# Patient Record
Sex: Female | Born: 1970 | Race: Black or African American | Hispanic: No | Marital: Single | Smoking: Current every day smoker
Health system: Southern US, Community
[De-identification: ages and names within clinical notes are randomized; demographics above are authoritative.]

---

## 2004-09-26 ENCOUNTER — Emergency Department: Payer: Self-pay | Admitting: Emergency Medicine

## 2004-09-27 ENCOUNTER — Other Ambulatory Visit: Payer: Self-pay

## 2006-05-18 IMAGING — CR DG CHEST 2V
1 series · 2 of 2 positions shown · non-contrast
Comparison: none

REASON FOR EXAM: Chest pain, cough
COMMENTS:

PROCEDURE:     DXR - DXR CHEST PA (OR AP) AND LATERAL  - September 27, 2004  [DATE]
RESULT:     Two views of the chest show the lungs are clear. The heart and
pulmonary vessels are normal. The bony and mediastinal structures are
unremarkable.

[Series 1: view not recorded · 0.17mm/px · 2 of 2 slices shown]
[im 1/2]
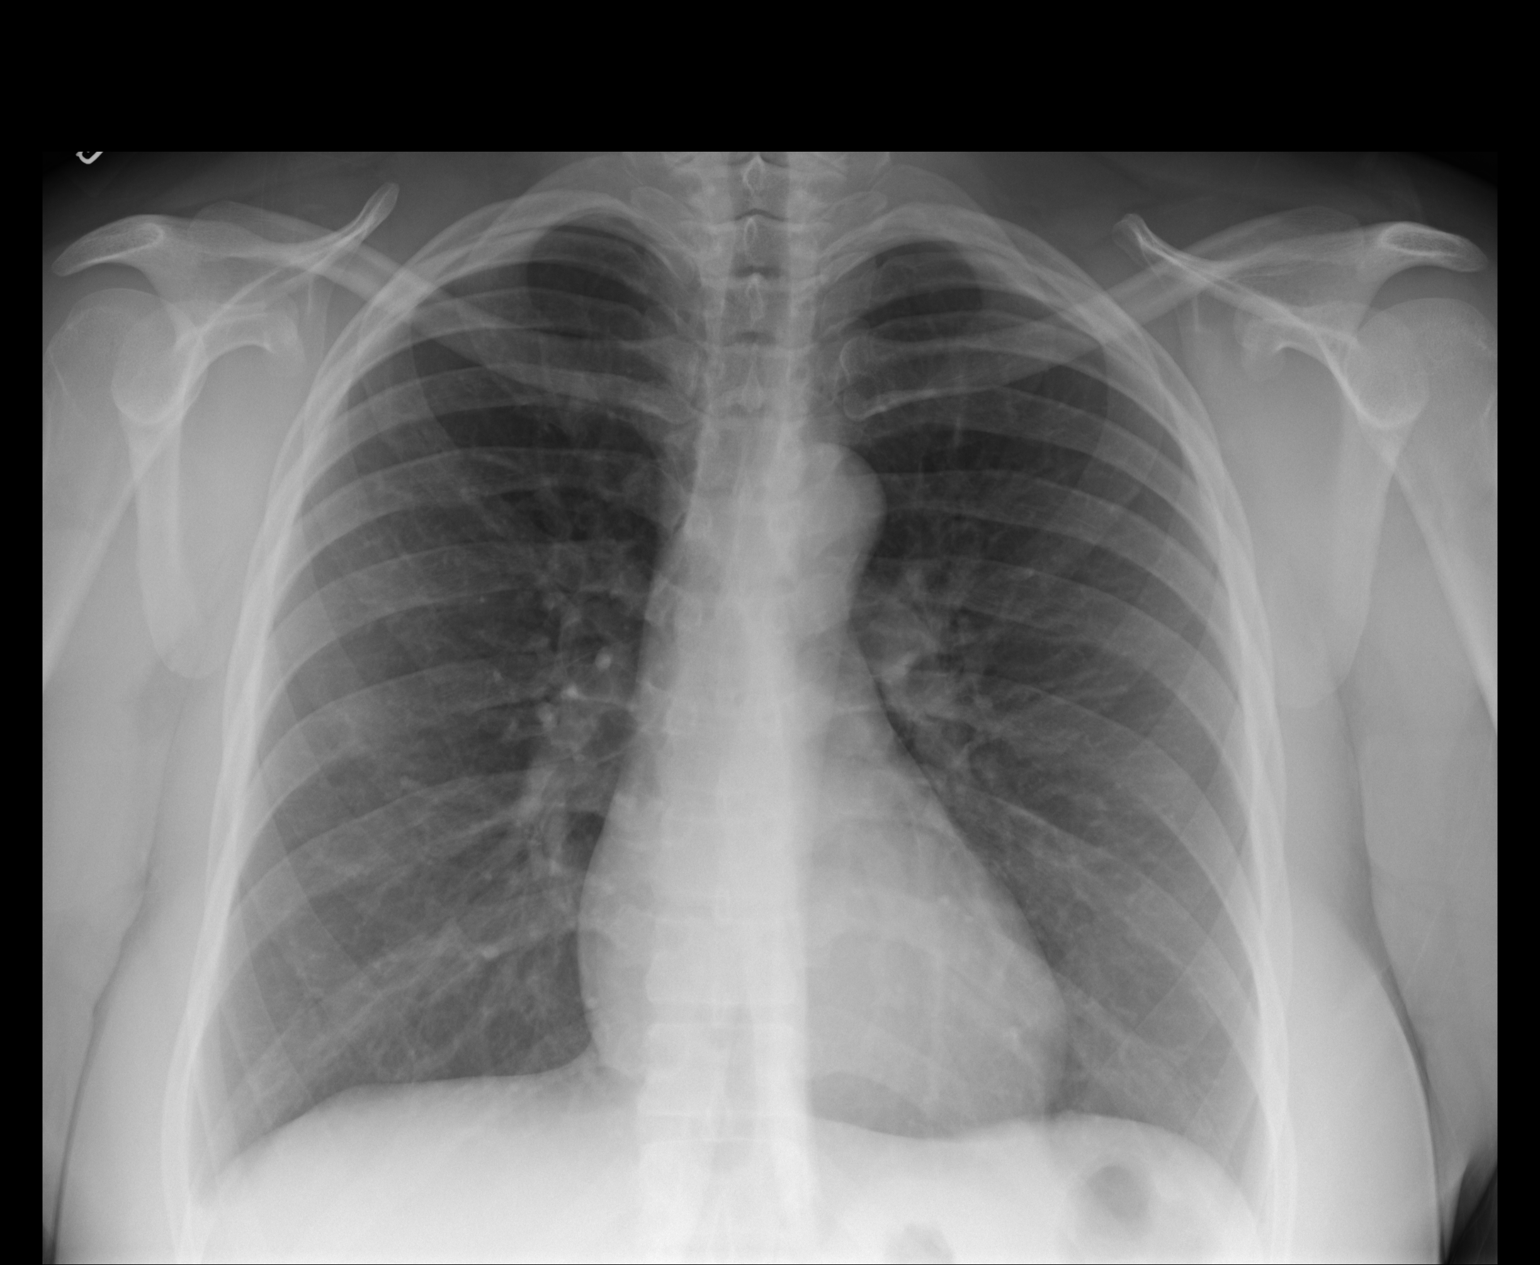
[im 2/2]
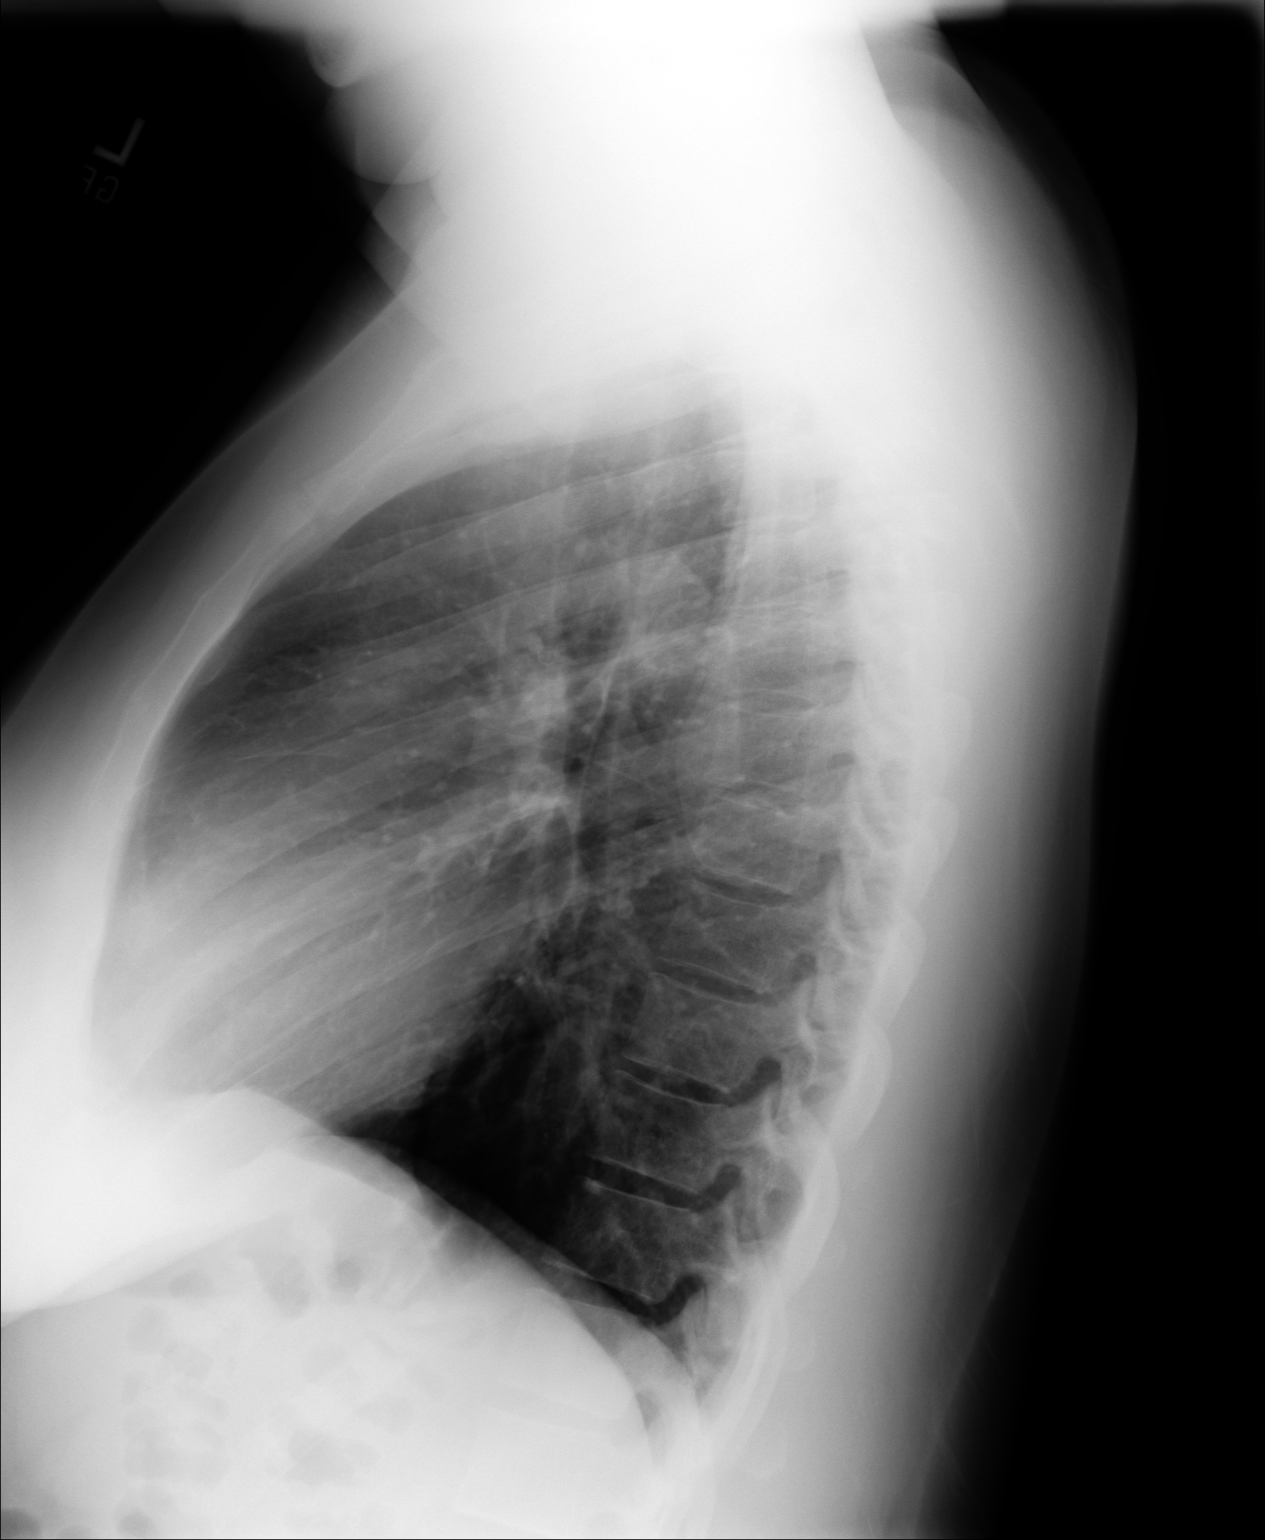

[2 of 2 positions shown; findings below may reference images not displayed]

IMPRESSION: No acute cardiopulmonary disease. The lungs are mildly
hyperinflated which can be consistent with COPD.

## 2008-05-15 ENCOUNTER — Emergency Department: Payer: Self-pay | Admitting: Emergency Medicine

## 2009-05-24 ENCOUNTER — Emergency Department: Payer: Self-pay | Admitting: Internal Medicine

## 2010-01-07 ENCOUNTER — Emergency Department: Payer: Self-pay | Admitting: Internal Medicine

## 2010-01-29 ENCOUNTER — Emergency Department: Payer: Self-pay | Admitting: Emergency Medicine

## 2011-04-25 ENCOUNTER — Emergency Department: Payer: Self-pay | Admitting: *Deleted

## 2012-08-26 ENCOUNTER — Emergency Department: Payer: Self-pay | Admitting: Emergency Medicine

## 2012-08-26 LAB — CBC WITH DIFFERENTIAL/PLATELET
Basophil #: 0 10*3/uL (ref 0.0–0.1)
Eosinophil #: 0.1 10*3/uL (ref 0.0–0.7)
Eosinophil %: 0.9 %
HCT: 34 % — ABNORMAL LOW (ref 35.0–47.0)
HGB: 11.3 g/dL — ABNORMAL LOW (ref 12.0–16.0)
Lymphocyte %: 9.9 %
MCV: 92 fL (ref 80–100)
Monocyte #: 0.8 x10 3/mm (ref 0.2–0.9)
Neutrophil #: 10.7 10*3/uL — ABNORMAL HIGH (ref 1.4–6.5)
Neutrophil %: 82.9 %
Platelet: 306 10*3/uL (ref 150–440)
RBC: 3.7 10*6/uL — ABNORMAL LOW (ref 3.80–5.20)
RDW: 15.7 % — ABNORMAL HIGH (ref 11.5–14.5)
WBC: 12.9 10*3/uL — ABNORMAL HIGH (ref 3.6–11.0)

## 2012-08-26 LAB — URINALYSIS, COMPLETE
Bilirubin,UR: NEGATIVE
Nitrite: NEGATIVE
Protein: 30

## 2012-08-26 LAB — COMPREHENSIVE METABOLIC PANEL
Alkaline Phosphatase: 80 U/L (ref 50–136)
Calcium, Total: 9.6 mg/dL (ref 8.5–10.1)
Co2: 26 mmol/L (ref 21–32)
Creatinine: 0.86 mg/dL (ref 0.60–1.30)
EGFR (Non-African Amer.): >60
Glucose: 97 mg/dL (ref 65–99)
Osmolality: 277 (ref 275–301)
Potassium: 3.8 mmol/L (ref 3.5–5.1)
SGOT(AST): 30 U/L (ref 15–37)
SGPT (ALT): 34 U/L (ref 12–78)
Sodium: 139 mmol/L (ref 136–145)
Total Protein: 8.3 g/dL — ABNORMAL HIGH (ref 6.4–8.2)

## 2012-08-26 LAB — PREGNANCY, URINE: Pregnancy Test, Urine: NEGATIVE m[IU]/mL

## 2013-06-10 ENCOUNTER — Emergency Department: Payer: Self-pay | Admitting: Emergency Medicine

## 2015-01-08 ENCOUNTER — Emergency Department: Payer: Self-pay

## 2015-01-08 ENCOUNTER — Emergency Department
Admission: EM | Admit: 2015-01-08 | Discharge: 2015-01-08 | Disposition: A | Discharge status: Home / Self Care | Payer: Self-pay | Attending: Emergency Medicine | Admitting: Emergency Medicine

## 2015-01-08 ENCOUNTER — Encounter: Payer: Self-pay | Admitting: *Deleted

## 2015-01-08 DIAGNOSIS — J40 Bronchitis, not specified as acute or chronic: Secondary | ICD-10-CM

## 2015-01-08 DIAGNOSIS — H9202 Otalgia, left ear: Secondary | ICD-10-CM | POA: Insufficient documentation

## 2015-01-08 DIAGNOSIS — R0981 Nasal congestion: Secondary | ICD-10-CM

## 2015-01-08 DIAGNOSIS — Z720 Tobacco use: Secondary | ICD-10-CM | POA: Insufficient documentation

## 2015-01-08 DIAGNOSIS — J209 Acute bronchitis, unspecified: Secondary | ICD-10-CM | POA: Insufficient documentation

## 2015-01-08 MED ORDER — BENZONATATE 100 MG PO CAPS
100.0000 mg | ORAL_CAPSULE | Freq: Once | ORAL | Status: AC
  Administered 2015-01-08: 100 mg via ORAL
  Filled 2015-01-08: qty 1

## 2015-01-08 MED ORDER — ALBUTEROL SULFATE HFA 108 (90 BASE) MCG/ACT IN AERS: 2.0000 | INHALATION_SPRAY | Freq: Four times a day (QID) | RESPIRATORY_TRACT | Status: AC | PRN

## 2015-01-08 MED ORDER — IPRATROPIUM-ALBUTEROL 0.5-2.5 (3) MG/3ML IN SOLN
3.0000 mL | Freq: Once | RESPIRATORY_TRACT | Status: AC
  Administered 2015-01-08: 3 mL via RESPIRATORY_TRACT
  Filled 2015-01-08: qty 3

## 2015-01-08 MED ORDER — OXYMETAZOLINE HCL 0.05 % NA SOLN: 2.0000 | Freq: Two times a day (BID) | NASAL | Status: AC

## 2015-01-08 MED ORDER — BENZONATATE 100 MG PO CAPS: 100.0000 mg | ORAL_CAPSULE | Freq: Three times a day (TID) | ORAL | Status: AC | PRN

## 2015-01-08 MED ORDER — IBUPROFEN 800 MG PO TABS
800.0000 mg | ORAL_TABLET | Freq: Once | ORAL | Status: AC
  Administered 2015-01-08: 800 mg via ORAL
  Filled 2015-01-08: qty 1

## 2015-01-08 MED ORDER — AZITHROMYCIN 250 MG PO TABS: ORAL_TABLET | ORAL | Status: AC

## 2015-01-08 NOTE — ED Notes (Addendum)
Pt reports left ear pain, left sided throat pain, sneezing, sinus drainage and congestion for about 5 days. Pt thinks she may have had a fever over the past several days.

## 2015-01-08 NOTE — ED Notes (Signed)
Patient presents to ED with c/o left ear pain radiating to throat, (+) shortness of breath with chest tightness and decreased appetite x 3 days. Patient reports has been having a cold x 5 days, has taken cold medicine without relief. Patient states "I feel like I'm going to pass out...that started yesterday. When asked about fever, patient states "I had a fever, it was like 101 two days ago." Patient denies abdominal pain, or other complaints. Patient alert and oriented x 4, respirations even and unlabored, speaking in complete sentences. Skin warm and dry.

## 2015-01-08 NOTE — ED Provider Notes (Signed)
[facility] Emergency Department Provider Note  ____________________________________________  Time seen: Approximately 5:29 AM  I have reviewed the triage vital signs and the nursing notes.   HISTORY  Chief Complaint Otalgia    HPI Donna Foster is a 44 y.o. female who comes into the hospital today feeling unwell. The patient reports that she's felt like she's wanted to pass out and had some shortness of breath. The patient also reports that she has some severe left ear pain. The patient reports that yesterday she felt as though she was in the past. She's had a cold for the past 5 days some discolored phlegm and a fever to 101. The patient reports that she developed a fever blister 2 days ago. She reports that everyone at her job is sick with pneumonia. She has not been eating well for the last 3 days and has no appetite. Her ear pain is 8 out of 10 in intensity. She has been taking cold medicine but reports that it has not been helping. The patient has been diagnosed with bronchitis in the past and is unsure if this may be causing her symptoms.   History reviewed. No pertinent past medical history.  There are no active problems to display for this patient.   History reviewed. No pertinent past surgical history.  Current Outpatient Rx  Name  Route  Sig  Dispense  Refill  . albuterol (PROVENTIL HFA;VENTOLIN HFA) 108 (90 BASE) MCG/ACT inhaler   Inhalation   Inhale 2 puffs into the lungs every 6 (six) hours as needed for wheezing or shortness of breath.   1 Inhaler   0   . azithromycin (ZITHROMAX Z-PAK) 250 MG tablet      Take 2 tablets (500 mg) on  Day 1,  followed by 1 tablet (250 mg) once daily on Days 2 through 5.   6 each   0   . benzonatate (TESSALON PERLES) 100 MG capsule   Oral   Take 1 capsule (100 mg total) by mouth 3 (three) times daily as needed for cough.   20 capsule   0   . oxymetazoline (AFRIN) 0.05 % nasal spray   Each Nare  Place 2 sprays into both nostrils 2 (two) times daily.   15 mL   0     Allergies Review of patient's allergies indicates no known allergies.  No family history on file.  Social History Social History  Substance Use Topics  . Smoking status: Current Every Day Smoker  . Smokeless tobacco: None  . Alcohol Use: No    Review of Systems Constitutional: Fever Eyes: No visual changes. ENT: Ear pain Cardiovascular: Chest tightness Respiratory: shortness of breath. Gastrointestinal: No abdominal pain.  No nausea, no vomiting.  No diarrhea.  No constipation. Genitourinary: Negative for dysuria. Musculoskeletal: Negative for back pain. Skin: Negative for rash. Neurological: Negative for headaches, focal weakness or numbness.  10-point ROS otherwise negative.  ____________________________________________   PHYSICAL EXAM:  VITAL SIGNS: ED Triage Vitals  Enc Vitals Group     BP 01/08/15 0239 120/86 mmHg     Pulse Rate 01/08/15 0239 76     Resp 01/08/15 0239 18     Temp 01/08/15 0239 98.3 F (36.8 C)     Temp Source 01/08/15 0239 Oral     SpO2 01/08/15 0239 96 %     Weight 01/08/15 0239 195 lb (88.451 kg)     Height 01/08/15 0239  (1.727 m)  Head Cir --      Peak Flow --      Pain Score 01/08/15 0240 8     Pain Loc --      Pain Edu? --      Excl. in GC? --     Constitutional: Alert and oriented. Well appearing and in mild distress. Eyes: Conjunctivae are normal. PERRL. EOMI. Head: Atraumatic. TMs without erythema or bulging Nose: No congestion/rhinnorhea. Mouth/Throat: Mucous membranes are moist.  Oropharynx non-erythematous. Cardiovascular: Normal rate, regular rhythm. Grossly normal heart sounds.  Good peripheral circulation. Respiratory: Normal respiratory effort.  No retractions. Mild anterior wheezing Gastrointestinal: Soft and nontender. No distention. Positive bowel sounds Musculoskeletal: No lower extremity tenderness nor edema.   Neurologic:  Normal  speech and language.  Skin:  Skin is warm, dry and intact.  Psychiatric: Mood and affect are normal.   ____________________________________________   LABS (all labs ordered are listed, but only abnormal results are displayed)  Labs Reviewed - No data to display ____________________________________________  EKG  ED ECG REPORT I, Rebecka Apley, the attending physician, personally viewed and interpreted this ECG.   Date: 01/08/2015  EKG Time: 445  Rate: 63  Rhythm: normal sinus rhythm  Axis: Normal  Intervals:none  ST&T Change: None  ____________________________________________  RADIOLOGY  Chest x-ray: No acute pulmonary process ____________________________________________   PROCEDURES  Procedure(s) performed: None  Critical Care performed: No  ____________________________________________   INITIAL IMPRESSION / ASSESSMENT AND PLAN / ED COURSE  Pertinent labs & imaging results that were available during my care of the patient were reviewed by me and considered in my medical decision making (see chart for details).  This is a 44 year old female who comes in with some congestion and shortness of breath chest tightness. The patient was given benzonatate and a DuoNeb for wheezing. The patient's breathing did improve after the DuoNeb. The patient does not have an ear infection. I feel that the patient has bronchitis with her symptoms. I will discharge her with some antibiotics as well as an albuterol inhaler and cough medicine. The patient will follow-up with her primary care physician. ____________________________________________   FINAL CLINICAL IMPRESSION(S) / ED DIAGNOSES  Final diagnoses:  Bronchitis  Otalgia, left  Nasal congestion      Rebecka Apley, MD 01/08/15 [phone]

## 2015-01-08 NOTE — ED Notes (Signed)
Patient tolerated drinking water, patient denies nausea or vomiting.

## 2015-01-08 NOTE — ED Notes (Signed)

## 2015-01-08 NOTE — Discharge Instructions (Signed)
Otalgia °The most common reason for this in children is an infection of the middle ear. Pain from the middle ear is usually caused by a build-up of fluid and pressure behind the eardrum. Pain from an earache can be sharp, dull, or burning. The pain may be temporary or constant. The middle ear is connected to the nasal passages by a short narrow tube called the Eustachian tube. The Eustachian tube allows fluid to drain out of the middle ear, and helps keep the pressure in your ear equalized. °CAUSES  °A cold or allergy can block the Eustachian tube with inflammation and the build-up of secretions. This is especially likely in small children, because their Eustachian tube is shorter and more horizontal. When the Eustachian tube closes, the normal flow of fluid from the middle ear is stopped. Fluid can accumulate and cause stuffiness, pain, hearing loss, and an ear infection if germs start growing in this area. °SYMPTOMS  °The symptoms of an ear infection may include fever, ear pain, fussiness, increased crying, and irritability. Many children will have temporary and minor hearing loss during and right after an ear infection. Permanent hearing loss is rare, but the risk increases the more infections a child has. Other causes of ear pain include retained water in the outer ear canal from swimming and bathing. °Ear pain in adults is less likely to be from an ear infection. Ear pain may be referred from other locations. Referred pain may be from the joint between your jaw and the skull. It may also come from a tooth problem or problems in the neck. Other causes of ear pain include: °· A foreign body in the ear. °· Outer ear infection. °· Sinus infections. °· Impacted ear wax. °· Ear injury. °· Arthritis of the jaw or TMJ problems. °· Middle ear infection. °· Tooth infections. °· Sore throat with pain to the ears. °DIAGNOSIS  °Your caregiver can usually make the diagnosis by examining you. Sometimes other special studies,  including x-rays and lab work may be necessary. °TREATMENT  °· If antibiotics were prescribed, use them as directed and finish them even if you or your child's symptoms seem to be improved. °· Sometimes PE tubes are needed in children. These are little plastic tubes which are put into the eardrum during a simple surgical procedure. They allow fluid to drain easier and allow the pressure in the middle ear to equalize. This helps relieve the ear pain caused by pressure changes. °HOME CARE INSTRUCTIONS  °· Only take over-the-counter or prescription medicines for pain, discomfort, or fever as directed by your caregiver. DO NOT GIVE CHILDREN ASPIRIN because of the association of Reye's Syndrome in children taking aspirin. °· Use a cold pack applied to the outer ear for 15-20 minutes, 03-04 times per day or as needed may reduce pain. Do not apply ice directly to the skin. You may cause frost bite. °· Over-the-counter ear drops used as directed may be effective. Your caregiver may sometimes prescribe ear drops. °· Resting in an upright position may help reduce pressure in the middle ear and relieve pain. °· Ear pain caused by rapidly descending from high altitudes can be relieved by swallowing or chewing gum. Allowing infants to suck on a bottle during airplane travel can help. °· Do not smoke in the house or near children. If you are unable to quit smoking, smoke outside. °· Control allergies. °SEEK IMMEDIATE MEDICAL CARE IF:  °· You or your child are becoming sicker. °· Pain or fever   unable to quit smoking, smoke outside.  · Control allergies.  SEEK IMMEDIATE MEDICAL CARE IF:   · You or your child are becoming sicker.  · Pain or fever relief is not obtained with medicine.  · You or your child's symptoms (pain, fever, or irritability) do not improve within 24 to 48 hours or as instructed.  · Severe pain suddenly stops hurting. This may indicate a ruptured eardrum.  · You or your children develop new problems such as severe headaches, stiff neck, difficulty swallowing, or swelling of the face or around the ear.  Document Released: 11/28/2003 Document Revised: 07/05/2011  Document Reviewed: 04/03/2008  ExitCare® Patient Information ©2015 [employer]. This information is not intended to replace advice given to you by your health care provider. Make sure you discuss any questions you have with your health care provider.  Upper Respiratory Infection, Adult  An upper respiratory infection (URI) is also sometimes known as the common cold. The upper respiratory tract includes the nose, sinuses, throat, trachea, and bronchi. Bronchi are the airways leading to the lungs. Most people improve within 1 week, but symptoms can last up to 2 weeks. A residual cough may last even longer.   CAUSES  Many different viruses can infect the tissues lining the upper respiratory tract. The tissues become irritated and inflamed and often become very moist. Mucus production is also common. A cold is contagious. You can easily spread the virus to others by oral contact. This includes kissing, sharing a glass, coughing, or sneezing. Touching your mouth or nose and then touching a surface, which is then touched by another person, can also spread the virus.  SYMPTOMS   Symptoms typically develop 1 to 3 days after you come in contact with a cold virus. Symptoms vary from person to person. They may include:  · Runny nose.  · Sneezing.  · Nasal congestion.  · Sinus irritation.  · Sore throat.  · Loss of voice (laryngitis).  · Cough.  · Fatigue.  · Muscle aches.  · Loss of appetite.  · Headache.  · Low-grade fever.  DIAGNOSIS   You might diagnose your own cold based on familiar symptoms, since most people get a cold 2 to 3 times a year. Your caregiver can confirm this based on your exam. Most importantly, your caregiver can check that your symptoms are not due to another disease such as strep throat, sinusitis, pneumonia, asthma, or epiglottitis. Blood tests, throat tests, and X-rays are not necessary to diagnose a common cold, but they may sometimes be helpful in excluding other more serious diseases. Your  caregiver will decide if any further tests are required.  RISKS AND COMPLICATIONS   You may be at risk for a more severe case of the common cold if you smoke cigarettes, have chronic heart disease (such as heart failure) or lung disease (such as asthma), or if you have a weakened immune system. The very young and very old are also at risk for more serious infections. Bacterial sinusitis, middle ear infections, and bacterial pneumonia can complicate the common cold. The common cold can worsen asthma and chronic obstructive pulmonary disease (COPD). Sometimes, these complications can require emergency medical care and may be life-threatening.  PREVENTION   The best way to protect against getting a cold is to practice good hygiene. Avoid oral or hand contact with people with cold symptoms. Wash your hands often if contact occurs. There is no clear evidence that vitamin C, vitamin E, echinacea, or exercise   reduces the chance of developing a cold. However, it is always recommended to get plenty of rest and practice good nutrition.  TREATMENT   Treatment is directed at relieving symptoms. There is no cure. Antibiotics are not effective, because the infection is caused by a virus, not by bacteria. Treatment may include:  · Increased fluid intake. Sports drinks offer valuable electrolytes, sugars, and fluids.  · Breathing heated mist or steam (vaporizer or shower).  · Eating chicken soup or other clear broths, and maintaining good nutrition.  · Getting plenty of rest.  · Using gargles or lozenges for comfort.  · Controlling fevers with ibuprofen or acetaminophen as directed by your caregiver.  · Increasing usage of your inhaler if you have asthma.  Zinc gel and zinc lozenges, taken in the first 24 hours of the common cold, can shorten the duration and lessen the severity of symptoms. Pain medicines may help with fever, muscle aches, and throat pain. A variety of non-prescription medicines are available to treat congestion  and runny nose. Your caregiver can make recommendations and may suggest nasal or lung inhalers for other symptoms.   HOME CARE INSTRUCTIONS   · Only take over-the-counter or prescription medicines for pain, discomfort, or fever as directed by your caregiver.  · Use a warm mist humidifier or inhale steam from a shower to increase air moisture. This may keep secretions moist and make it easier to breathe.  · Drink enough water and fluids to keep your urine clear or pale yellow.  · Rest as needed.  · Return to work when your temperature has returned to normal or as your caregiver advises. You may need to stay home longer to avoid infecting others. You can also use a face mask and careful hand washing to prevent spread of the virus.  SEEK MEDICAL CARE IF:   · After the first few days, you feel you are getting worse rather than better.  · You need your caregiver's advice about medicines to control symptoms.  · You develop chills, worsening shortness of breath, or [place] or red sputum. These may be signs of pneumonia.  · You develop yellow or [place] nasal discharge or pain in the face, especially when you bend forward. These may be signs of sinusitis.  · You develop a fever, swollen neck glands, pain with swallowing, or white areas in the back of your throat. These may be signs of strep throat.  SEEK IMMEDIATE MEDICAL CARE IF:   · You have a fever.  · You develop severe or persistent headache, ear pain, sinus pain, or chest pain.  · You develop wheezing, a prolonged cough, cough up blood, or have a change in your usual mucus (if you have chronic lung disease).  · You develop sore muscles or a stiff neck.  Document Released: 10/06/2000 Document Revised: 07/05/2011 Document Reviewed: 07/18/2013  ExitCare® Patient Information ©2015 [employer]. This information is not intended to replace advice given to you by your health care provider. Make sure you discuss any questions you have with your health care provider.

## 2015-01-08 NOTE — ED Notes (Signed)
Patient transported to X-ray 

## 2015-01-29 IMAGING — CR RIGHT FOOT COMPLETE - 3+ VIEW
1 series · 3 of 3 positions shown · non-contrast
Comparison: None.

CLINICAL DATA: Medial foot pain.  No known injury.

EXAM:
RIGHT FOOT COMPLETE - 3+ VIEW

[Series 1: x foot ap right · 0.14mm/px · 3 of 3 slices shown]
[im 1/3]
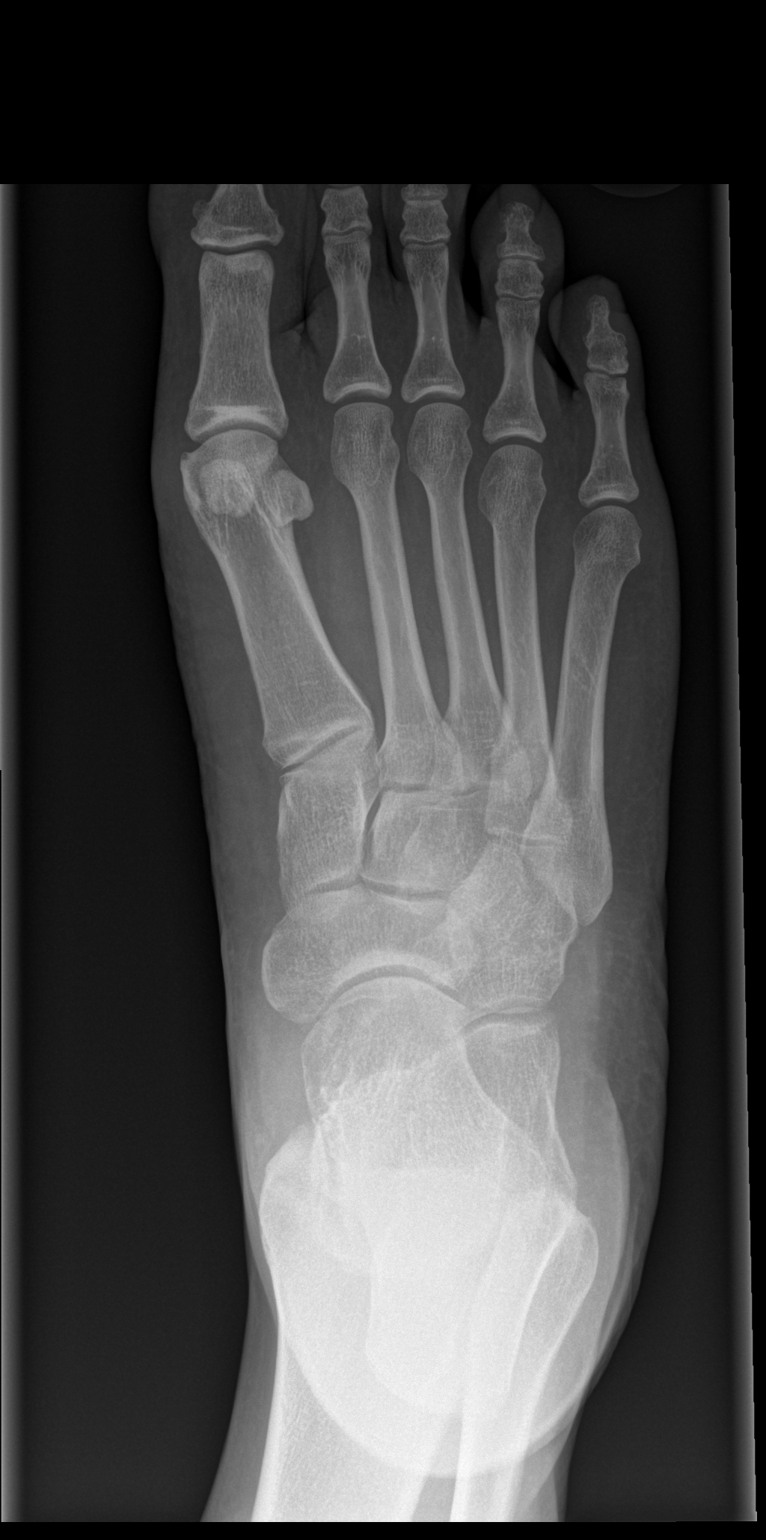
[im 2/3]
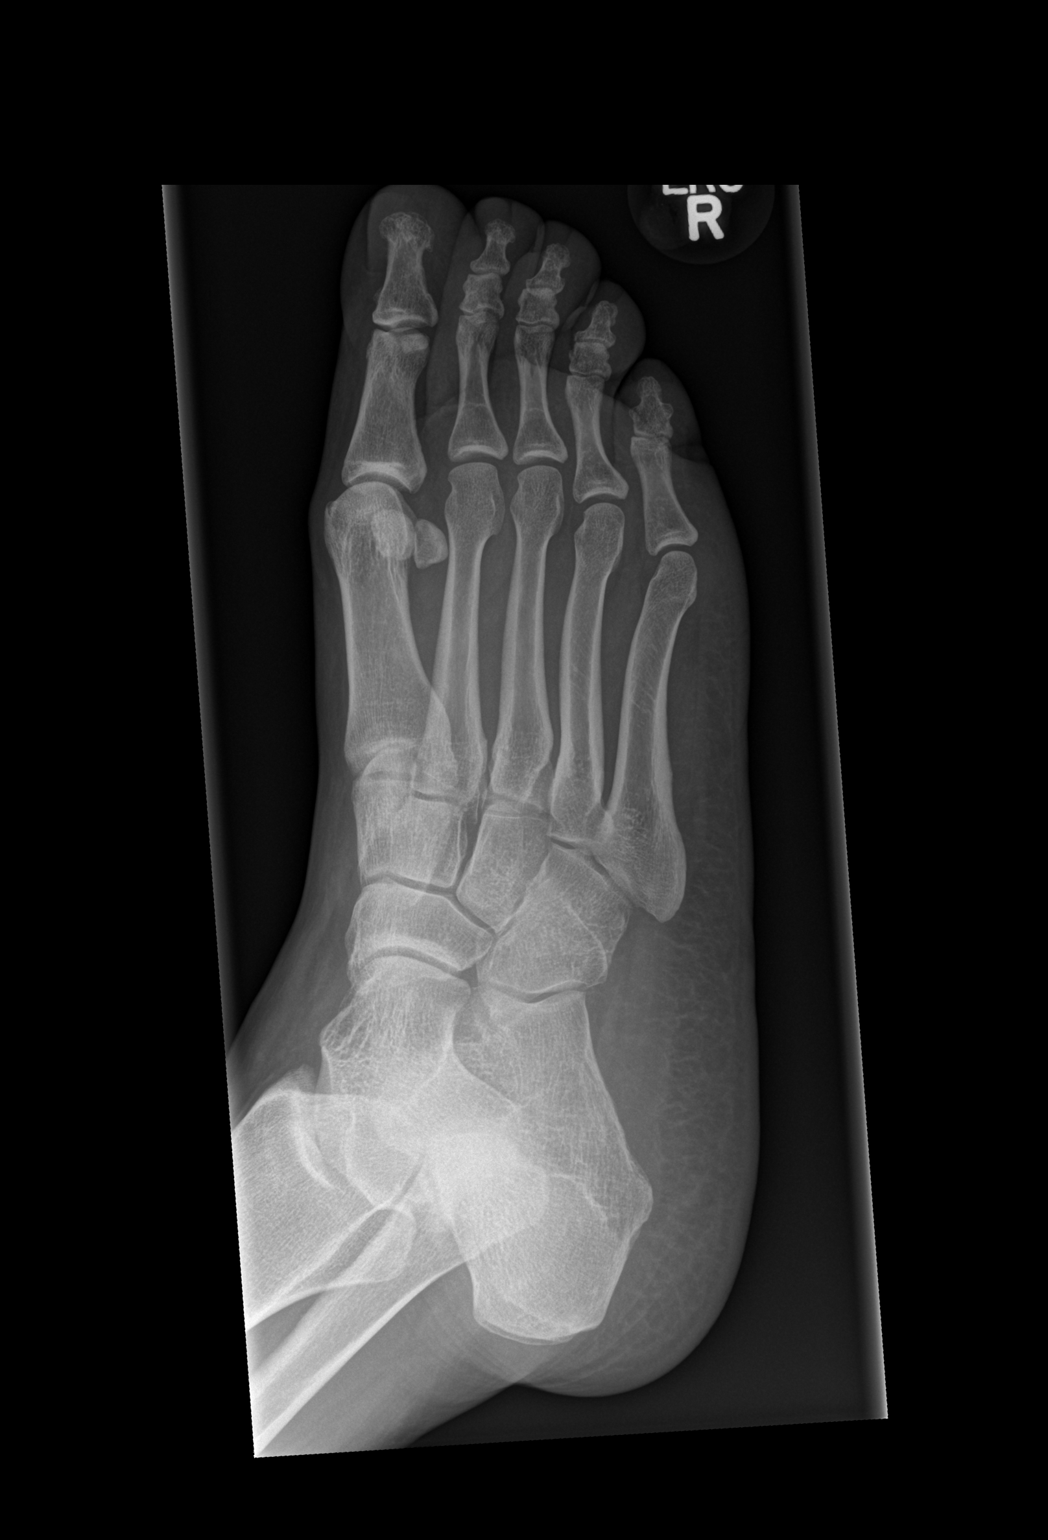
[im 3/3]
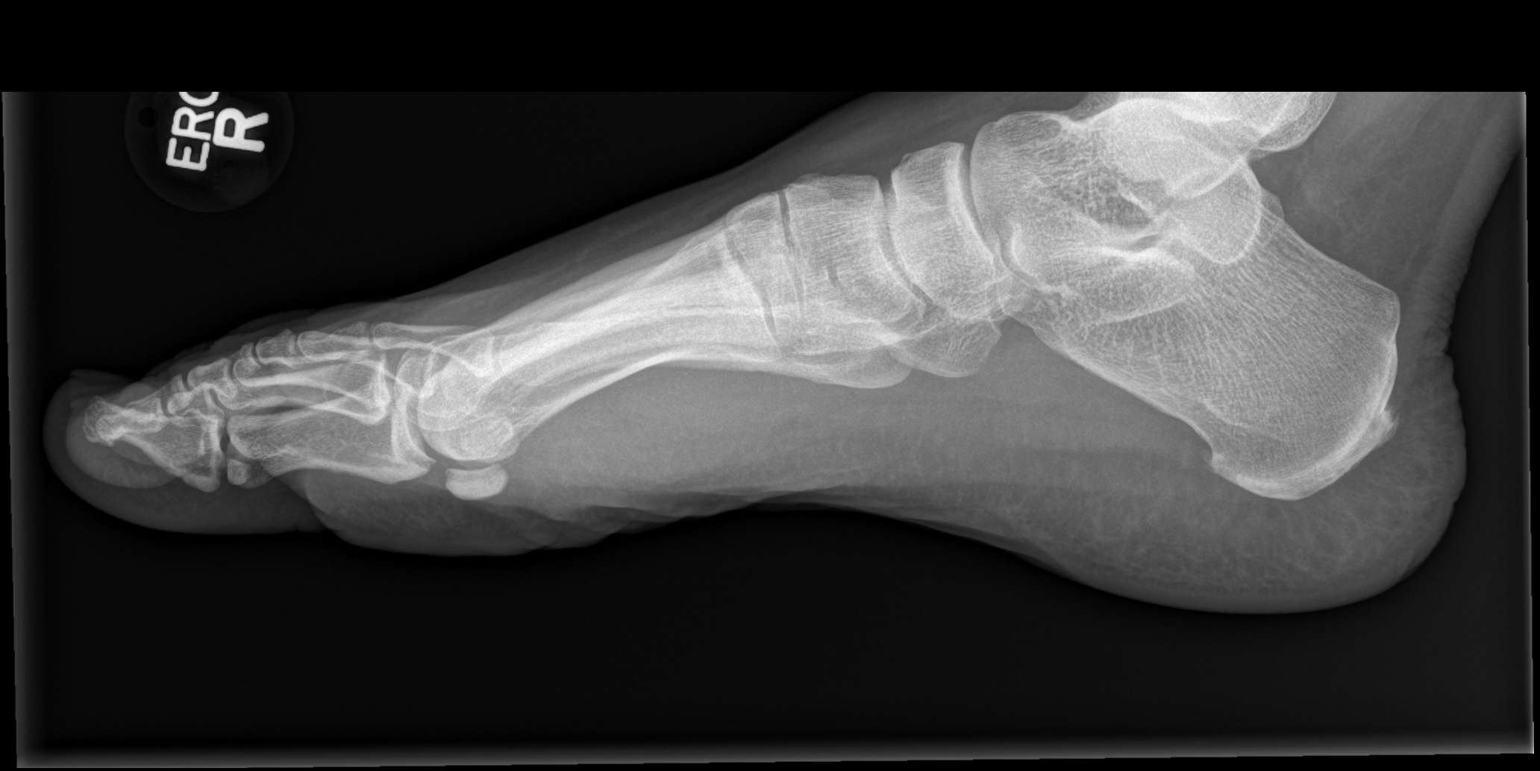

[3 of 3 positions shown; findings below may reference images not displayed]

FINDINGS: No acute bony abnormality. Specifically, no fracture, subluxation,
or dislocation. Soft tissues are intact. Early degenerative spurring
and joint space narrowing in the first MTP joint.
IMPRESSION: No acute bony abnormality.

## 2015-08-06 ENCOUNTER — Encounter: Payer: Self-pay | Admitting: Emergency Medicine

## 2015-08-06 ENCOUNTER — Emergency Department
Admission: EM | Admit: 2015-08-06 | Discharge: 2015-08-06 | Disposition: A | Discharge status: Home / Self Care | Payer: Self-pay | Attending: Emergency Medicine | Admitting: Emergency Medicine

## 2015-08-06 DIAGNOSIS — F17200 Nicotine dependence, unspecified, uncomplicated: Secondary | ICD-10-CM | POA: Insufficient documentation

## 2015-08-06 DIAGNOSIS — L03115 Cellulitis of right lower limb: Secondary | ICD-10-CM | POA: Insufficient documentation

## 2015-08-06 MED ORDER — OXYCODONE-ACETAMINOPHEN 5-325 MG PO TABS: 1.0000 | ORAL_TABLET | ORAL | Status: AC | PRN

## 2015-08-06 MED ORDER — CEPHALEXIN 500 MG PO CAPS
500.0000 mg | ORAL_CAPSULE | Freq: Once | ORAL | Status: AC
Start: 2015-08-06 — End: 2015-08-06
  Administered 2015-08-06: 500 mg via ORAL

## 2015-08-06 MED ORDER — CEPHALEXIN 500 MG PO CAPS
ORAL_CAPSULE | ORAL | Status: AC
  Administered 2015-08-06: 500 mg via ORAL
  Filled 2015-08-06: qty 1

## 2015-08-06 MED ORDER — CEPHALEXIN 500 MG PO CAPS: 500.0000 mg | ORAL_CAPSULE | Freq: Two times a day (BID) | ORAL | Status: AC

## 2015-08-06 MED ORDER — OXYCODONE-ACETAMINOPHEN 5-325 MG PO TABS
ORAL_TABLET | ORAL | Status: AC
  Administered 2015-08-06: 1 via ORAL
  Filled 2015-08-06: qty 1

## 2015-08-06 MED ORDER — OXYCODONE-ACETAMINOPHEN 5-325 MG PO TABS
1.0000 | ORAL_TABLET | Freq: Once | ORAL | Status: AC
  Administered 2015-08-06: 1 via ORAL

## 2015-08-06 NOTE — ED Notes (Signed)
Pt discharged to home.  Family member driving.  Discharge instructions reviewed.  Verbalized understanding.  No questions or concerns at this time.  Teach back verified.  Pt in NAD.  No items left in ED.   

## 2015-08-06 NOTE — ED Provider Notes (Signed)
[facility] Emergency Department Provider Note  ____________________________________________  Time seen: 3:00 AM  I have reviewed the triage vital signs and the nursing notes.   HISTORY  Chief Complaint Leg Pain      HPI Donna Foster is a 45 y.o. female presents with right lower extremity pain currently 7 out of 10 patient states that she noticed that there was a bite on the outside of her leg with subsequent swelling and pain 3 days     Past medical history None There are no active problems to display for this patient.   Past surgical history None  Current Outpatient Rx  Name  Route  Sig  Dispense  Refill  . albuterol (PROVENTIL HFA;VENTOLIN HFA) 108 (90 BASE) MCG/ACT inhaler   Inhalation   Inhale 2 puffs into the lungs every 6 (six) hours as needed for wheezing or shortness of breath.   1 Inhaler   0   . benzonatate (TESSALON PERLES) 100 MG capsule   Oral   Take 1 capsule (100 mg total) by mouth 3 (three) times daily as needed for cough.   20 capsule   0     Allergies No known drug allergies History reviewed. No pertinent family history.  Social History Social History  Substance Use Topics  . Smoking status: Current Every Day Smoker  . Smokeless tobacco: None  . Alcohol Use: No    Review of Systems  Constitutional: Negative for fever. Eyes: Negative for visual changes. ENT: Negative for sore throat. Cardiovascular: Negative for chest pain. Respiratory: Negative for shortness of breath. Gastrointestinal: Negative for abdominal pain, vomiting and diarrhea. Genitourinary: Negative for dysuria. Musculoskeletal: Negative for back pain. Skin: Positive for insect bite redness and swelling right leg Neurological: Negative for headaches, focal weakness or numbness.   10-point ROS otherwise negative.  ____________________________________________   PHYSICAL EXAM:  VITAL SIGNS: ED Triage Vitals  Enc Vitals Group      BP 08/06/15 0110 135/64 mmHg     Pulse Rate 08/06/15 0110 75     Resp 08/06/15 0110 18     Temp 08/06/15 0110 98.3 F (36.8 C)     Temp Source 08/06/15 0110 Oral     SpO2 08/06/15 0110 100 %     Weight 08/06/15 0110 208 lb 12.4 oz (94.7 kg)     Height 08/06/15 0110 5\' 6"  (1.676 m)     Head Cir --      Peak Flow --      Pain Score 08/06/15 0112 8     Pain Loc --      Pain Edu? --      Excl. in GC? --     Constitutional: Alert and oriented. Well appearing and in no distress. Eyes: Conjunctivae are normal. PERRL. Normal extraocular movements. ENT   Head: Normocephalic and atraumatic.   Nose: No congestion/rhinnorhea.   Mouth/Throat: Mucous membranes are moist.   Neck: No stridor. Hematological/Lymphatic/Immunilogical: No cervical lymphadenopathy. Cardiovascular: Normal rate, regular rhythm. Normal and symmetric distal pulses are present in all extremities. No murmurs, rubs, or gallops. Respiratory: Normal respiratory effort without tachypnea nor retractions. Breath sounds are clear and equal bilaterally. No wheezes/rales/rhonchi. Gastrointestinal: Soft and nontender. No distention. There is no CVA tenderness. Genitourinary: deferred Musculoskeletal: Nontender with normal range of motion in all extremities. No joint effusions.  No lower extremity tenderness nor edema. Neurologic:  Normal speech and language. No gross focal neurologic deficits are appreciated. Speech is normal.  Skin:  Cellulitis area  of blanching erythema with excoriation noted right lower extremity Psychiatric: Mood and affect are normal. Speech and behavior are normal. Patient exhibits appropriate insight and judgment.     INITIAL IMPRESSION / ASSESSMENT AND PLAN / ED COURSE  Pertinent labs & imaging results that were available during my care of the patient were reviewed by me and considered in my medical decision making (see chart for details).  Patient received Keflex 500 mg as well as  Percocet.  ____________________________________________   FINAL CLINICAL IMPRESSION(S) / ED DIAGNOSES  Final diagnoses:  Cellulitis of right leg      Darci Current, MD 08/06/15 [phone]

## 2015-08-06 NOTE — Discharge Instructions (Signed)

## 2015-08-06 NOTE — ED Notes (Signed)
Pt arrived to the ED for complaints of right leg pain. Pt states that she has noticed that there is a "bite" on her right leg that is causing swelling and pain. Pt is AOx4 in no apparent distress.

## 2016-08-28 IMAGING — CR DG CHEST 2V
2 series · 2 of 2 positions shown · non-contrast
Comparison: 09/27/2004

CLINICAL DATA: Shortness of breath and chest pain. Left ear pain,
left-sided throat pain, sneezing, sinus drainage and congestion for
5 days.

EXAM:
CHEST  2 VIEW

[chest pa]
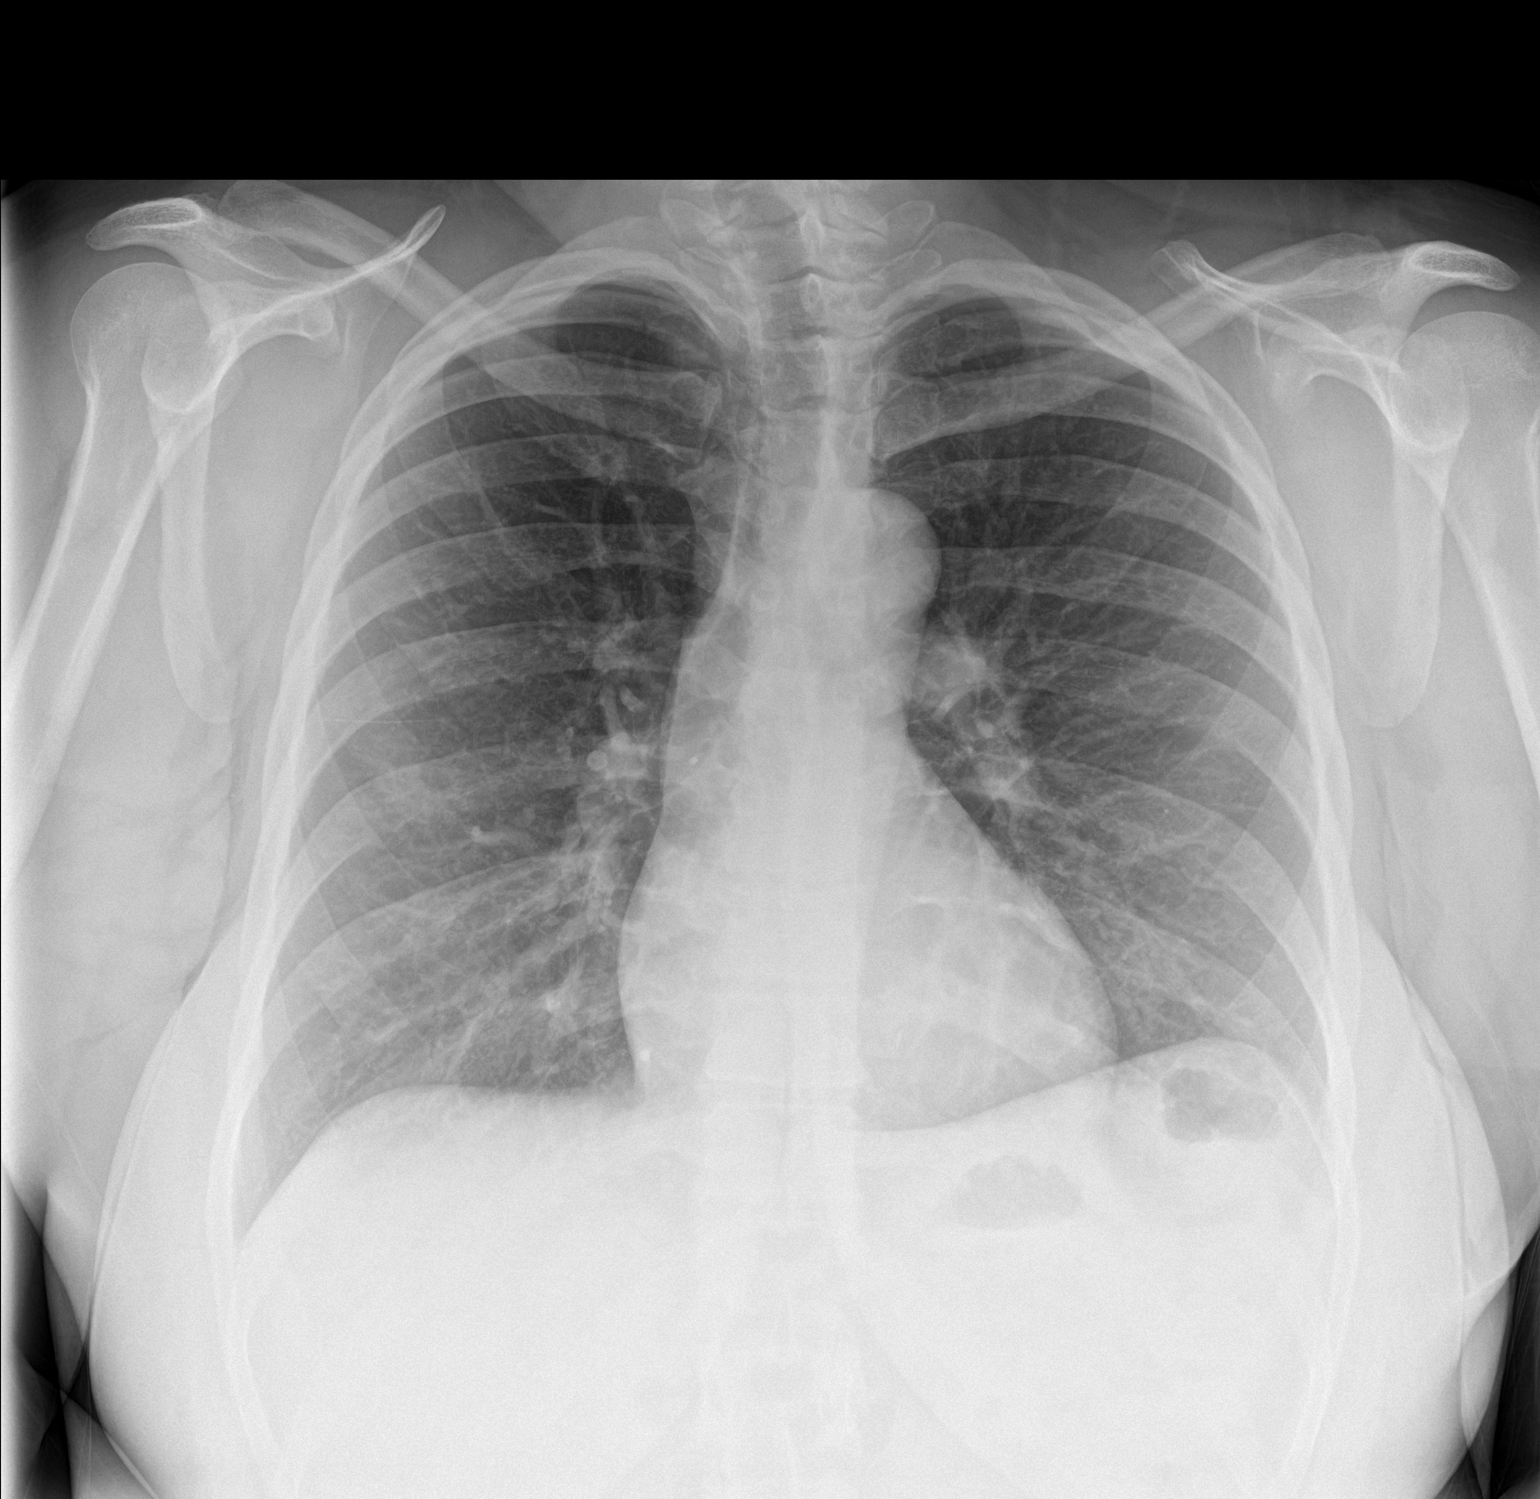

[chest lat]
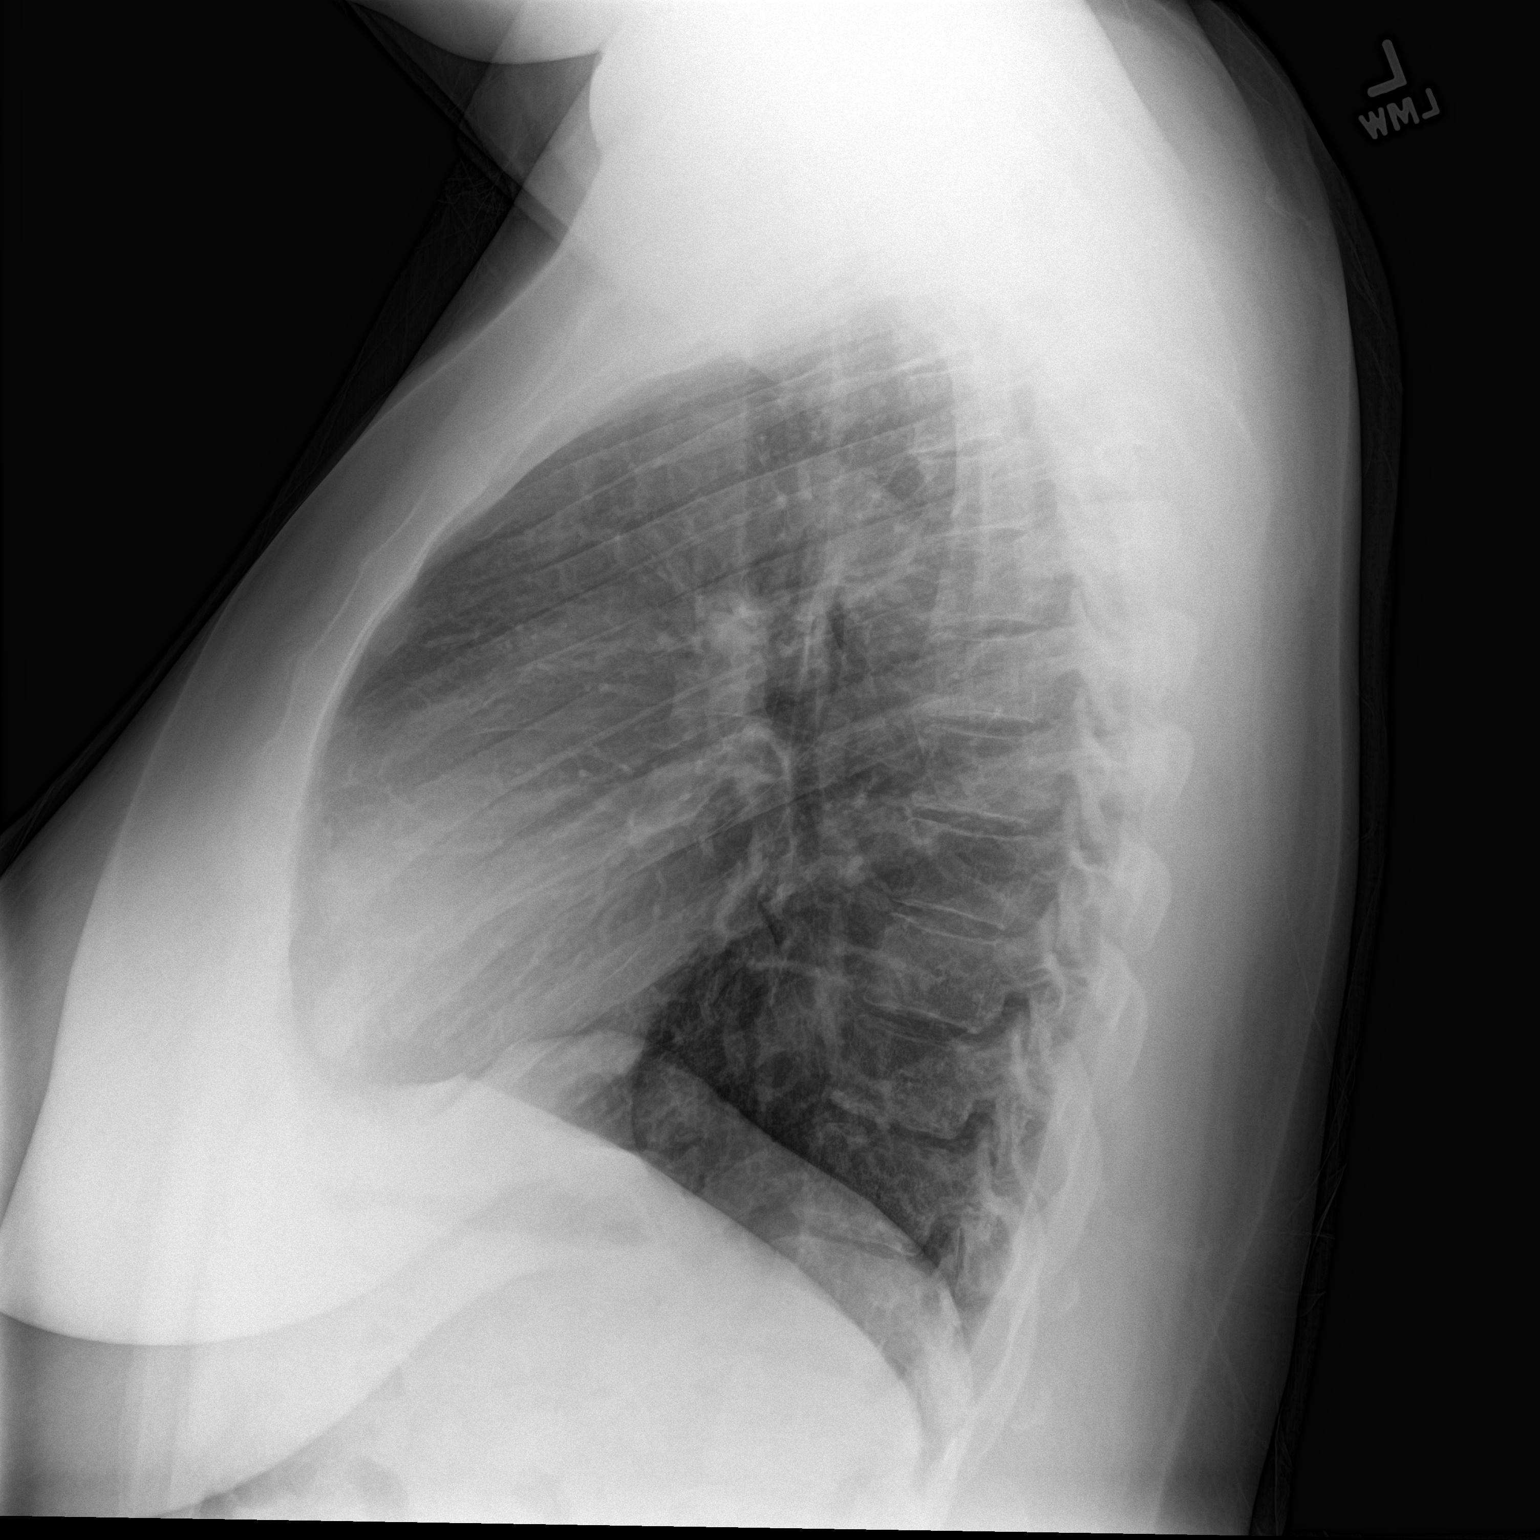

[2 of 2 positions shown; findings below may reference images not displayed]

FINDINGS: The cardiomediastinal contours are normal. The lungs are clear.
Pulmonary vasculature is normal. No consolidation, pleural effusion,
or pneumothorax. No acute osseous abnormalities are seen.
IMPRESSION: No acute pulmonary process.
